# Patient Record
Sex: Female | Born: 1960
Health system: Southern US, Community
[De-identification: ages and names within clinical notes are randomized; demographics above are authoritative.]

## PROBLEM LIST (undated history)

## (undated) DIAGNOSIS — J45909 Unspecified asthma, uncomplicated: Secondary | ICD-10-CM

## (undated) HISTORY — DX: Unspecified asthma, uncomplicated: J45.909

## (undated) HISTORY — PX: WISDOM TOOTH EXTRACTION: SHX21

---

## 1997-08-14 ENCOUNTER — Other Ambulatory Visit: Admission: RE | Admit: 1997-08-14 | Discharge: 1997-08-14 | Payer: Self-pay | Admitting: Obstetrics and Gynecology

## 1997-09-02 ENCOUNTER — Inpatient Hospital Stay (HOSPITAL_COMMUNITY): Admission: AD | Admit: 1997-09-02 | Discharge: 1997-09-02 | Payer: Self-pay | Admitting: *Deleted

## 1997-09-04 ENCOUNTER — Inpatient Hospital Stay (HOSPITAL_COMMUNITY): Admission: AD | Admit: 1997-09-04 | Discharge: 1997-09-06 | Payer: Self-pay | Admitting: Obstetrics and Gynecology

## 1997-09-11 ENCOUNTER — Inpatient Hospital Stay (HOSPITAL_COMMUNITY): Admission: AD | Admit: 1997-09-11 | Discharge: 1997-09-13 | Payer: Self-pay | Admitting: Obstetrics and Gynecology

## 1997-09-25 ENCOUNTER — Inpatient Hospital Stay: Admission: AD | Admit: 1997-09-25 | Discharge: 1997-09-25 | Payer: Self-pay | Admitting: Obstetrics and Gynecology

## 1997-09-26 ENCOUNTER — Inpatient Hospital Stay (HOSPITAL_COMMUNITY): Admission: AD | Admit: 1997-09-26 | Discharge: 1997-09-29 | Payer: Self-pay | Admitting: Obstetrics and Gynecology

## 2000-09-04 ENCOUNTER — Other Ambulatory Visit: Admission: RE | Admit: 2000-09-04 | Discharge: 2000-09-04 | Payer: Self-pay | Admitting: Family Medicine

## 2000-09-11 ENCOUNTER — Encounter: Admission: RE | Admit: 2000-09-11 | Discharge: 2000-09-11 | Payer: Self-pay | Admitting: Family Medicine

## 2000-09-11 ENCOUNTER — Encounter: Payer: Self-pay | Admitting: Family Medicine

## 2010-03-20 ENCOUNTER — Encounter: Payer: Self-pay | Admitting: Family Medicine

## 2012-02-13 ENCOUNTER — Other Ambulatory Visit: Payer: Self-pay | Admitting: Family Medicine

## 2012-02-13 DIAGNOSIS — Z1231 Encounter for screening mammogram for malignant neoplasm of breast: Secondary | ICD-10-CM

## 2012-03-15 ENCOUNTER — Ambulatory Visit
Admission: RE | Admit: 2012-03-15 | Discharge: 2012-03-15 | Disposition: A | Discharge status: Home / Self Care | Payer: BC Managed Care – PPO | Source: Ambulatory Visit | Attending: Family Medicine | Admitting: Family Medicine

## 2012-03-15 DIAGNOSIS — Z1231 Encounter for screening mammogram for malignant neoplasm of breast: Secondary | ICD-10-CM

## 2012-04-05 ENCOUNTER — Encounter: Payer: Self-pay | Admitting: Family Medicine

## 2012-12-10 ENCOUNTER — Ambulatory Visit (INDEPENDENT_AMBULATORY_CARE_PROVIDER_SITE_OTHER): Payer: BC Managed Care – PPO | Admitting: *Deleted

## 2012-12-10 DIAGNOSIS — Z23 Encounter for immunization: Secondary | ICD-10-CM

## 2013-06-05 ENCOUNTER — Ambulatory Visit: Payer: BC Managed Care – PPO | Admitting: Family Medicine

## 2014-12-01 ENCOUNTER — Ambulatory Visit (INDEPENDENT_AMBULATORY_CARE_PROVIDER_SITE_OTHER): Payer: BLUE CROSS/BLUE SHIELD

## 2014-12-01 DIAGNOSIS — Z23 Encounter for immunization: Secondary | ICD-10-CM | POA: Diagnosis not present

## 2016-01-17 ENCOUNTER — Ambulatory Visit (INDEPENDENT_AMBULATORY_CARE_PROVIDER_SITE_OTHER): Payer: BLUE CROSS/BLUE SHIELD | Admitting: Urgent Care

## 2016-01-17 DIAGNOSIS — Z23 Encounter for immunization: Secondary | ICD-10-CM

## 2016-10-20 ENCOUNTER — Encounter: Payer: BLUE CROSS/BLUE SHIELD | Admitting: Obstetrics & Gynecology

## 2016-11-03 ENCOUNTER — Encounter: Payer: BLUE CROSS/BLUE SHIELD | Admitting: Obstetrics & Gynecology

## 2016-12-01 ENCOUNTER — Ambulatory Visit: Payer: BLUE CROSS/BLUE SHIELD | Admitting: Urgent Care

## 2017-05-18 ENCOUNTER — Other Ambulatory Visit: Payer: Self-pay | Admitting: Obstetrics & Gynecology

## 2017-05-18 ENCOUNTER — Other Ambulatory Visit: Payer: Self-pay | Admitting: Family Medicine

## 2017-05-18 DIAGNOSIS — Z1231 Encounter for screening mammogram for malignant neoplasm of breast: Secondary | ICD-10-CM

## 2017-08-31 ENCOUNTER — Ambulatory Visit: Payer: BLUE CROSS/BLUE SHIELD | Admitting: Obstetrics & Gynecology

## 2017-08-31 ENCOUNTER — Encounter: Payer: Self-pay | Admitting: Obstetrics & Gynecology

## 2017-08-31 VITALS — BP 136/80 | Ht 63.0 in | Wt 289.0 lb

## 2017-08-31 DIAGNOSIS — Z6841 Body Mass Index (BMI) 40.0 and over, adult: Secondary | ICD-10-CM | POA: Diagnosis not present

## 2017-08-31 DIAGNOSIS — Z01419 Encounter for gynecological examination (general) (routine) without abnormal findings: Secondary | ICD-10-CM

## 2017-08-31 DIAGNOSIS — Z789 Other specified health status: Secondary | ICD-10-CM | POA: Diagnosis not present

## 2017-08-31 DIAGNOSIS — N914 Secondary oligomenorrhea: Secondary | ICD-10-CM

## 2017-08-31 MED ORDER — MEDROXYPROGESTERONE ACETATE 5 MG PO TABS: 5.0000 mg | ORAL_TABLET | Freq: Every day | ORAL | 4 refills | Status: DC

## 2017-08-31 NOTE — Patient Instructions (Signed)
1. Encounter for routine gynecological examination with Papanicolaou smear of cervix Normal gynecologic exam.  Pap with high-risk HPV done today.  Breast exam normal.  Will schedule screening mammogram at the breast center now.  Needs to organize colonoscopy, refer to gastroenterology now.  Fasting health labs here today. - CBC - Comp Met (CMET) - Lipid panel - TSH - VITAMIN D 25 Hydroxy (Vit-D Deficiency, Fractures)  2. Use of condoms for contraception  3. Secondary oligomenorrhea Probably perimenopausal, which will prescribe Provera 5 mg/tab. 1 tablet per mouth daily for 10 days cyclically every 3 months if no spontaneous menstrual period.  Usage, risks and benefits reviewed with patient.  Counseling done on perimenopause and the importance of having a withdrawal bleeding at least every 3 months to prevent endometrial hyperplasia and cancer.  Patient instructed to use Provera every 3 months and if no withdrawal bleeding occurs twice in a row, that will be diagnostic of menopause and she can then stop doing the cyclic Provera.  4. Class 3 severe obesity due to excess calories without serious comorbidity with body mass index (BMI) of 50.0 to 59.9 in adult [facility]) Severe obesity with a body mass index of 51.19.  Planning a right knee replacement.  Needs to lose weight prior to surgery.  Patient will start on a fitness program with water aerobics, elliptical or biking.  Recommend exercising at least 5 times a week for aerobics and weight lifting every 2 days.  Recommend a low calorie/low carb diet such as [employer].  Other orders - medroxyPROGESTERone (PROVERA) 5 MG tablet; Take 1 tablet (5 mg total) by mouth daily for 10 days. Cyclic Provera every 3 months if no spontaneous menstrual period.  Ashley Bishop, it was a pleasure seeing you today!  I will inform you of your results as soon as they are available.  I will refer you to a gastroenterologist to organize a screening colonoscopy.

## 2017-08-31 NOTE — Addendum Note (Signed)
Addended by: Tito DineBONHAM, KIM A on: 08/31/2017 12:02 PM   Modules accepted: Orders

## 2017-08-31 NOTE — Progress Notes (Addendum)
Ashley Bishop Sep 10, 1960 [id number]   History:    57 y.o.   RP:  New patient, >3 yrs since last Gyn visit, presenting for annual gyn exam   HPI: Menses every 3-8 months in last 2 years.  Mild occasional hot flushes.  LMP 06/13/2017 was normal with light/moderate flow.  Had a menstrual period 8 months before that one.  No pelvic pain.  No pain with IC.  Normal vaginal secretions.  Breasts wnl.  BMI 51.19.  Seen by Ortho for Rt knee replacement 3 weeks ago.  Needs to loose weight before surgery. Will start on Water aerobics/Elleptical or bike.  Healthy diet with vegetables, but too high in carbs per patient.  Will do fasting Health Labs here today.  Past medical history,surgical history, family history and social history were all reviewed and documented in the EPIC chart.  Gynecologic History Patient's last menstrual period was 06/13/2017. Contraception: condoms Last Pap: 09/2013. Results were: negative/HPV HR neg Last mammogram: 05/2012 . Results were: Negative Bone Density: Never Colonoscopy: Never, will refer to [facility] now  Obstetric History OB History  Gravida Para Term Preterm AB Living  2       0 2  SAB TAB Ectopic Multiple Live Births      0        # Outcome Date GA Lbr Len/2nd Weight Sex Delivery Anes PTL Lv  2 Gravida           1 Gravida              ROS: A ROS was performed and pertinent positives and negatives are included in the history.  GENERAL: No fevers or chills. HEENT: No change in vision, no earache, sore throat or sinus congestion. NECK: No pain or stiffness. CARDIOVASCULAR: No chest pain or pressure. No palpitations. PULMONARY: No shortness of breath, cough or wheeze. GASTROINTESTINAL: No abdominal pain, nausea, vomiting or diarrhea, melena or bright red blood per rectum. GENITOURINARY: No urinary frequency, urgency, hesitancy or dysuria. MUSCULOSKELETAL: No joint or muscle pain, no back pain, no recent trauma. DERMATOLOGIC: No rash, no itching, no lesions. ENDOCRINE:  No polyuria, polydipsia, no heat or cold intolerance. No recent change in weight. HEMATOLOGICAL: No anemia or easy bruising or bleeding. NEUROLOGIC: No headache, seizures, numbness, tingling or weakness. PSYCHIATRIC: No depression, no loss of interest in normal activity or change in sleep pattern.     Exam:   BP 136/80 (BP Location: Right Arm, Patient Position: Sitting, Cuff Size: Normal)   Ht _0  (1.6 m)   Wt 289 lb (131.1 kg)   LMP 06/13/2017   BMI 51.19 kg/m   Body mass index is 51.19 kg/m.  General appearance : Well developed well nourished female. No acute distress HEENT: Eyes: no retinal hemorrhage or exudates,  Neck supple, trachea midline, no carotid bruits, no thyroidmegaly Lungs: Clear to auscultation, no rhonchi or wheezes, or rib retractions  Heart: Regular rate and rhythm, no murmurs or gallops Breast:Examined in sitting and supine position were symmetrical in appearance, no palpable masses or tenderness,  no skin retraction, no nipple inversion, no nipple discharge, no skin discoloration, no axillary or supraclavicular lymphadenopathy Abdomen: no palpable masses or tenderness, no rebound or guarding Extremities: no edema or skin discoloration or tenderness  Pelvic: Vulva: Normal             Vagina: No gross lesions or discharge  Cervix: No gross lesions or discharge.  Pap/HPV HR done.  Uterus  AV, normal size, shape  and consistency, non-tender and mobile  Adnexa  Without masses or tenderness  Anus: Normal   Assessment/Plan:  57 y.o. female for annual exam   1. Encounter for routine gynecological examination with Papanicolaou smear of cervix Normal gynecologic exam.  Pap with high-risk HPV done today.  Breast exam normal.  Will schedule screening mammogram at the breast center now.  Needs to organize colonoscopy, refer to gastroenterology now.  Fasting health labs here today. - CBC - Comp Met (CMET) - Lipid panel - TSH - VITAMIN D 25 Hydroxy (Vit-D Deficiency,  Fractures)  2. Use of condoms for contraception  3. Secondary oligomenorrhea Probably perimenopausal, which will prescribe Provera 5 mg/tab. 1 tablet per mouth daily for 10 days cyclically every 3 months if no spontaneous menstrual period.  Usage, risks and benefits reviewed with patient.  Counseling done on perimenopause and the importance of having a withdrawal bleeding at least every 3 months to prevent endometrial hyperplasia and cancer.  Patient instructed to use Provera every 3 months and if no withdrawal bleeding occurs twice in a row, that will be diagnostic of menopause and she can then stop doing the cyclic Provera.  4. Class 3 severe obesity due to excess calories without serious comorbidity with body mass index (BMI) of 50.0 to 59.9 in adult [facility]) Severe obesity with a body mass index of 51.19.  Planning a right knee replacement.  Needs to lose weight prior to surgery.  Patient will start on a fitness program with water aerobics, elliptical or biking.  Recommend exercising at least 5 times a week for aerobics and weight lifting every 2 days.  Recommend a low calorie/low carb diet such as [employer].  Other orders - medroxyPROGESTERone (PROVERA) 5 MG tablet; Take 1 tablet (5 mg total) by mouth daily for 10 days. Cyclic Provera every 3 months if no spontaneous menstrual period.  Counseling on above issues and coordination of care more than 50% for 10 minutes.  Princess Bruins MD, 10:51 AM 08/31/2017

## 2017-09-03 ENCOUNTER — Telehealth: Payer: Self-pay | Admitting: *Deleted

## 2017-09-03 ENCOUNTER — Encounter: Payer: Self-pay | Admitting: Gastroenterology

## 2017-09-03 DIAGNOSIS — Z1211 Encounter for screening for malignant neoplasm of colon: Secondary | ICD-10-CM

## 2017-09-03 LAB — PAP, TP IMAGING W/ HPV RNA, RFLX HPV TYPE 16,18/45: HPV DNA High Risk: NOT DETECTED

## 2017-09-03 NOTE — Telephone Encounter (Signed)
Patient scheduled on 10/26/17 @ 11:00am

## 2017-09-03 NOTE — Telephone Encounter (Signed)
-----   Message from Genia DelMarie-Lyne Lavoie, MD sent at 08/31/2017 11:24 AM EDT ----- Regarding: Refer to [facility] for Screening Colonoscopy 57 yo

## 2017-09-03 NOTE — Telephone Encounter (Signed)
Referral placed at [facility] they will call to schedule. Pt aware.

## 2017-09-04 LAB — LIPID PANEL
Cholesterol: 181 mg/dL (ref <200)
HDL: 54 mg/dL (ref >50)
LDL Cholesterol (Calc): 107 mg/dL (calc) — ABNORMAL HIGH
Non-HDL Cholesterol (Calc): 127 mg/dL (calc) (ref <130)
Total CHOL/HDL Ratio: 3.4 (calc) (ref <5.0)
Triglycerides: 107 mg/dL (ref <150)

## 2017-09-04 LAB — CBC
HCT: 41.7 % (ref 35.0–45.0)
Hemoglobin: 13.4 g/dL (ref 11.7–15.5)
MCH: 24.9 pg — ABNORMAL LOW (ref 27.0–33.0)
MCHC: 32.1 g/dL (ref 32.0–36.0)
MCV: 77.5 fL — ABNORMAL LOW (ref 80.0–100.0)
MPV: 10.3 fL (ref 7.5–12.5)
Platelets: 304 10*3/uL (ref 140–400)
RBC: 5.38 10*6/uL — ABNORMAL HIGH (ref 3.80–5.10)
RDW: 14.4 % (ref 11.0–15.0)
WBC: 5.7 10*3/uL (ref 3.8–10.8)

## 2017-09-04 LAB — TEST AUTHORIZATION

## 2017-09-04 LAB — COMPREHENSIVE METABOLIC PANEL
AG Ratio: 1.4 (calc) (ref 1.0–2.5)
ALT: 23 U/L (ref 6–29)
AST: 22 U/L (ref 10–35)
Albumin: 3.9 g/dL (ref 3.6–5.1)
Alkaline phosphatase (APISO): 100 U/L (ref 33–130)
BUN: 16 mg/dL (ref 7–25)
CO2: 27 mmol/L (ref 20–32)
Calcium: 9.3 mg/dL (ref 8.6–10.4)
Chloride: 101 mmol/L (ref 98–110)
Creat: 0.71 mg/dL (ref 0.50–1.05)
Globulin: 2.8 g/dL (calc) (ref 1.9–3.7)
Glucose, Bld: 110 mg/dL — ABNORMAL HIGH (ref 65–99)
Potassium: 5 mmol/L (ref 3.5–5.3)
Sodium: 139 mmol/L (ref 135–146)
Total Bilirubin: 0.5 mg/dL (ref 0.2–1.2)
Total Protein: 6.7 g/dL (ref 6.1–8.1)

## 2017-09-04 LAB — TSH: TSH: 1.56 m[IU]/L (ref 0.40–4.50)

## 2017-09-04 LAB — HEMOGLOBIN A1C
Hgb A1c MFr Bld: 5.9 % of total Hgb — ABNORMAL HIGH (ref <5.7)
Mean Plasma Glucose: 123 (calc)
eAG (mmol/L): 6.8 (calc)

## 2017-09-04 LAB — VITAMIN D 25 HYDROXY (VIT D DEFICIENCY, FRACTURES): Vit D, 25-Hydroxy: 12 ng/mL — ABNORMAL LOW (ref 30–100)

## 2017-11-09 ENCOUNTER — Encounter: Payer: Self-pay | Admitting: Gastroenterology

## 2017-12-04 ENCOUNTER — Ambulatory Visit (INDEPENDENT_AMBULATORY_CARE_PROVIDER_SITE_OTHER): Payer: BLUE CROSS/BLUE SHIELD | Admitting: Family Medicine

## 2017-12-04 DIAGNOSIS — Z23 Encounter for immunization: Secondary | ICD-10-CM

## 2018-02-11 ENCOUNTER — Other Ambulatory Visit: Payer: Self-pay | Admitting: Obstetrics & Gynecology

## 2018-02-11 DIAGNOSIS — R5381 Other malaise: Secondary | ICD-10-CM

## 2018-02-18 ENCOUNTER — Encounter: Payer: Self-pay | Admitting: Obstetrics & Gynecology

## 2018-02-18 ENCOUNTER — Ambulatory Visit: Payer: BLUE CROSS/BLUE SHIELD | Admitting: Obstetrics & Gynecology

## 2018-02-18 VITALS — BP 136/80

## 2018-02-18 DIAGNOSIS — Z803 Family history of malignant neoplasm of breast: Secondary | ICD-10-CM | POA: Diagnosis not present

## 2018-02-18 DIAGNOSIS — N951 Menopausal and female climacteric states: Secondary | ICD-10-CM

## 2018-02-18 DIAGNOSIS — N644 Mastodynia: Secondary | ICD-10-CM | POA: Diagnosis not present

## 2018-02-18 NOTE — Progress Notes (Signed)
    Ashley Bishop Ashley Bishop 05/31/1960 [id number]        57 y.o.  G2P2L2   RP: Bilateral breast pain/tenderness on-off x 6 months  HPI: Patient complains of bilateral breast pain and tenderness intermittently for the last 6 months.  No lump felt.  No change in skin.  No nipple discharge.  No breast trauma.  Amenorrhea x 06/2017.  Occasional hot flushes.  Sister with Breast Ca at age 11053.  No recent mammogram, last screening was negative in April 2014.   OB History  Gravida Para Term Preterm AB Living  2       0 2  SAB TAB Ectopic Multiple Live Births      0        # Outcome Date GA Lbr Len/2nd Weight Sex Delivery Anes PTL Lv  2 Gravida           1 Gravida             Past medical history,surgical history, problem list, medications, allergies, family history and social history were all reviewed and documented in the EPIC chart.   Directed ROS with pertinent positives and negatives documented in the history of present illness/assessment and plan.  Exam:  Vitals:   02/18/18 0956  BP: 136/80   General appearance:  Normal  Breast exam: Normal bilateral breast exam except for tenderness bilaterally on the lower external aspects.  No nodule or mass felt.  No skin change.  No nipple discharge.  Axillae are normal bilaterally.   Assessment/Plan:  57 y.o. G2P0002   1. Pain of both breasts Whole breast tenderness associated with perimenopausal changes in hormones as well as fibrocystic breast disease.  Will rule out more significant pathology especially in the context of h/o breast cancer in sister.  2. Family history of breast cancer in sister Patient will inform herself with sister of any genetic testing done.  If no testing done/or if positive results, will refer for genetic counseling/testing.  3. Perimenopausal Verify menopausal status with FSH. - FSH  Counseling on above issues and coordination of care >50% x 25 minutes.  Genia DelMarie-Lyne Seline Enzor MD, 10:08 AM 02/18/2018

## 2018-02-19 LAB — FOLLICLE STIMULATING HORMONE: FSH: 16.5 m[IU]/mL

## 2018-02-21 ENCOUNTER — Telehealth: Payer: Self-pay | Admitting: *Deleted

## 2018-02-21 ENCOUNTER — Encounter: Payer: Self-pay | Admitting: Obstetrics & Gynecology

## 2018-02-21 ENCOUNTER — Other Ambulatory Visit: Payer: Self-pay | Admitting: *Deleted

## 2018-02-21 DIAGNOSIS — N644 Mastodynia: Secondary | ICD-10-CM

## 2018-02-21 DIAGNOSIS — Z803 Family history of malignant neoplasm of breast: Secondary | ICD-10-CM

## 2018-02-21 MED ORDER — MEDROXYPROGESTERONE ACETATE 5 MG PO TABS: ORAL_TABLET | ORAL | 4 refills | Status: DC

## 2018-02-21 NOTE — Telephone Encounter (Signed)
-----   Message from Genia DelMarie-Lyne Lavoie, MD sent at 02/18/2018 10:18 AM EST ----- Regarding: Refer for Bilateral Dx mammo/US Bilateral breast pain/tenderness.  Sister with Breast Ca.

## 2018-02-21 NOTE — Patient Instructions (Signed)
1. Pain of both breasts Whole breast tenderness associated with perimenopausal changes in hormones as well as fibrocystic breast disease.  Will rule out more significant pathology especially in the context of h/o breast cancer in sister.  2. Family history of breast cancer in sister Patient will inform herself with sister of any genetic testing done.  If no testing done/or if positive results, will refer for genetic counseling/testing.  3. Perimenopausal Verify menopausal status with FSH. - FSH  Ashley Bishop, it was a pleasure seeing you today!  I will inform you of your results as soon as they are available.

## 2018-02-21 NOTE — Telephone Encounter (Signed)
Appointment at breast center on 02/25/18 @ 2:30pm patient informed

## 2018-02-25 ENCOUNTER — Ambulatory Visit: Payer: Self-pay

## 2018-02-25 ENCOUNTER — Ambulatory Visit
Admission: RE | Admit: 2018-02-25 | Discharge: 2018-02-25 | Disposition: A | Discharge status: Home / Self Care | Payer: BLUE CROSS/BLUE SHIELD | Source: Ambulatory Visit | Attending: Obstetrics & Gynecology | Admitting: Obstetrics & Gynecology

## 2018-02-25 DIAGNOSIS — Z803 Family history of malignant neoplasm of breast: Secondary | ICD-10-CM

## 2018-02-25 DIAGNOSIS — R928 Other abnormal and inconclusive findings on diagnostic imaging of breast: Secondary | ICD-10-CM | POA: Diagnosis not present

## 2018-02-25 DIAGNOSIS — N644 Mastodynia: Secondary | ICD-10-CM

## 2018-09-04 ENCOUNTER — Encounter: Payer: BLUE CROSS/BLUE SHIELD | Admitting: Obstetrics & Gynecology

## 2018-12-06 ENCOUNTER — Ambulatory Visit (INDEPENDENT_AMBULATORY_CARE_PROVIDER_SITE_OTHER): Payer: BC Managed Care – PPO | Admitting: Family Medicine

## 2018-12-06 ENCOUNTER — Other Ambulatory Visit: Payer: Self-pay

## 2018-12-06 DIAGNOSIS — Z23 Encounter for immunization: Secondary | ICD-10-CM

## 2018-12-06 NOTE — Patient Instructions (Signed)
Injection given in the right deltoid, she tolerated well.  

## 2019-07-07 ENCOUNTER — Other Ambulatory Visit: Payer: Self-pay | Admitting: Family Medicine

## 2019-07-07 DIAGNOSIS — Z1231 Encounter for screening mammogram for malignant neoplasm of breast: Secondary | ICD-10-CM

## 2019-07-18 ENCOUNTER — Other Ambulatory Visit: Payer: Self-pay

## 2019-07-18 ENCOUNTER — Ambulatory Visit
Admission: RE | Admit: 2019-07-18 | Discharge: 2019-07-18 | Disposition: A | Discharge status: Home / Self Care | Payer: BLUE CROSS/BLUE SHIELD | Source: Ambulatory Visit | Attending: Family Medicine | Admitting: Family Medicine

## 2019-07-18 DIAGNOSIS — Z1231 Encounter for screening mammogram for malignant neoplasm of breast: Secondary | ICD-10-CM | POA: Diagnosis not present

## 2019-07-22 ENCOUNTER — Other Ambulatory Visit: Payer: Self-pay | Admitting: Family Medicine

## 2019-07-22 DIAGNOSIS — N6320 Unspecified lump in the left breast, unspecified quadrant: Secondary | ICD-10-CM

## 2019-08-07 ENCOUNTER — Other Ambulatory Visit: Payer: Self-pay | Admitting: Family Medicine

## 2019-08-07 ENCOUNTER — Ambulatory Visit
Admission: RE | Admit: 2019-08-07 | Discharge: 2019-08-07 | Disposition: A | Discharge status: Home / Self Care | Payer: BC Managed Care – PPO | Source: Ambulatory Visit | Attending: Family Medicine | Admitting: Family Medicine

## 2019-08-07 ENCOUNTER — Other Ambulatory Visit: Payer: Self-pay

## 2019-08-07 DIAGNOSIS — R599 Enlarged lymph nodes, unspecified: Secondary | ICD-10-CM

## 2019-08-07 DIAGNOSIS — N6320 Unspecified lump in the left breast, unspecified quadrant: Secondary | ICD-10-CM

## 2019-08-07 DIAGNOSIS — R2232 Localized swelling, mass and lump, left upper limb: Secondary | ICD-10-CM | POA: Diagnosis not present

## 2019-11-10 ENCOUNTER — Other Ambulatory Visit: Payer: Self-pay

## 2019-11-10 ENCOUNTER — Ambulatory Visit
Admission: RE | Admit: 2019-11-10 | Discharge: 2019-11-10 | Disposition: A | Discharge status: Home / Self Care | Payer: BC Managed Care – PPO | Source: Ambulatory Visit | Attending: Family Medicine | Admitting: Family Medicine

## 2019-11-10 DIAGNOSIS — R599 Enlarged lymph nodes, unspecified: Secondary | ICD-10-CM

## 2019-11-10 DIAGNOSIS — R590 Localized enlarged lymph nodes: Secondary | ICD-10-CM | POA: Diagnosis not present

## 2020-02-06 ENCOUNTER — Ambulatory Visit (INDEPENDENT_AMBULATORY_CARE_PROVIDER_SITE_OTHER): Payer: BC Managed Care – PPO | Admitting: Registered Nurse

## 2020-02-06 ENCOUNTER — Other Ambulatory Visit: Payer: Self-pay

## 2020-02-06 DIAGNOSIS — Z23 Encounter for immunization: Secondary | ICD-10-CM | POA: Diagnosis not present

## 2020-02-06 NOTE — Patient Instructions (Signed)
° ° ° °  If you have lab work done today you will be contacted with your lab results within the next 2 weeks.  If you have not heard from us then please contact us. The fastest way to get your results is to register for My Chart. ° ° °IF you received an x-ray today, you will receive an invoice from [facility]. Please contact [facility] at [phone] with questions or concerns regarding your invoice.  ° °IF you received labwork today, you will receive an invoice from [facility]. Please contact [facility] at [phone] with questions or concerns regarding your invoice.  ° °Our billing staff will not be able to assist you with questions regarding bills from these companies. ° °You will be contacted with the lab results as soon as they are available. The fastest way to get your results is to activate your My Chart account. Instructions are located on the last page of this paperwork. If you have not heard from us regarding the results in 2 weeks, please contact this office. °  ° ° ° °

## 2020-04-09 ENCOUNTER — Ambulatory Visit: Payer: BC Managed Care – PPO | Admitting: Registered Nurse

## 2020-08-20 IMAGING — MG DIGITAL SCREENING BILAT W/ TOMO W/ CAD
8 of 15 series · 8 of 40 positions shown · non-contrast
Comparison: Previous exam(s).

CLINICAL DATA: Screening.

EXAM:
DIGITAL SCREENING BILATERAL MAMMOGRAM WITH TOMO AND CAD

[R CC synth-2D (1 of 2)]
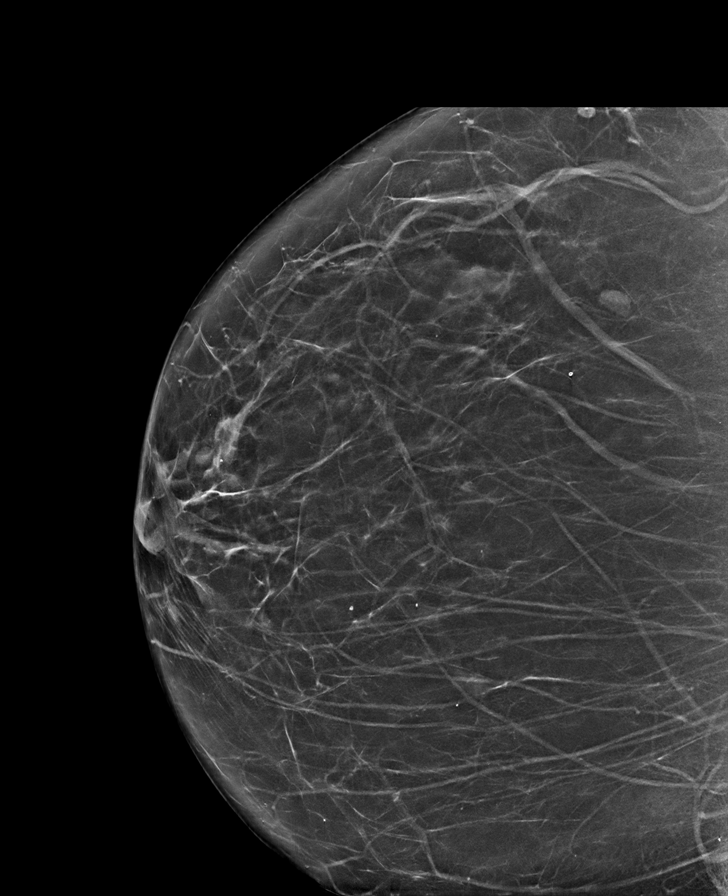

[R CV synth-2D]
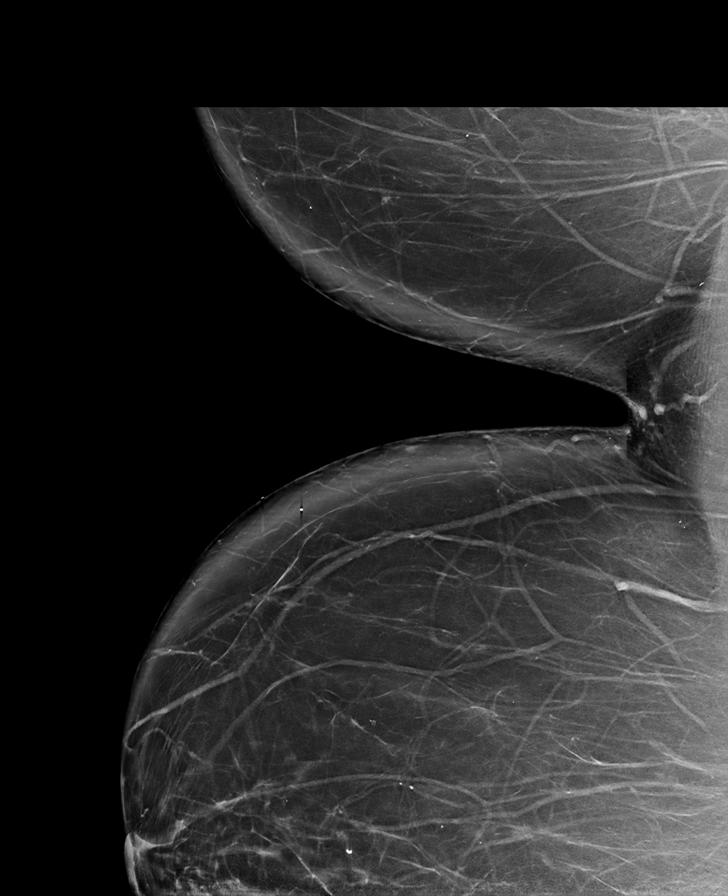

[L MLO synth-2D]
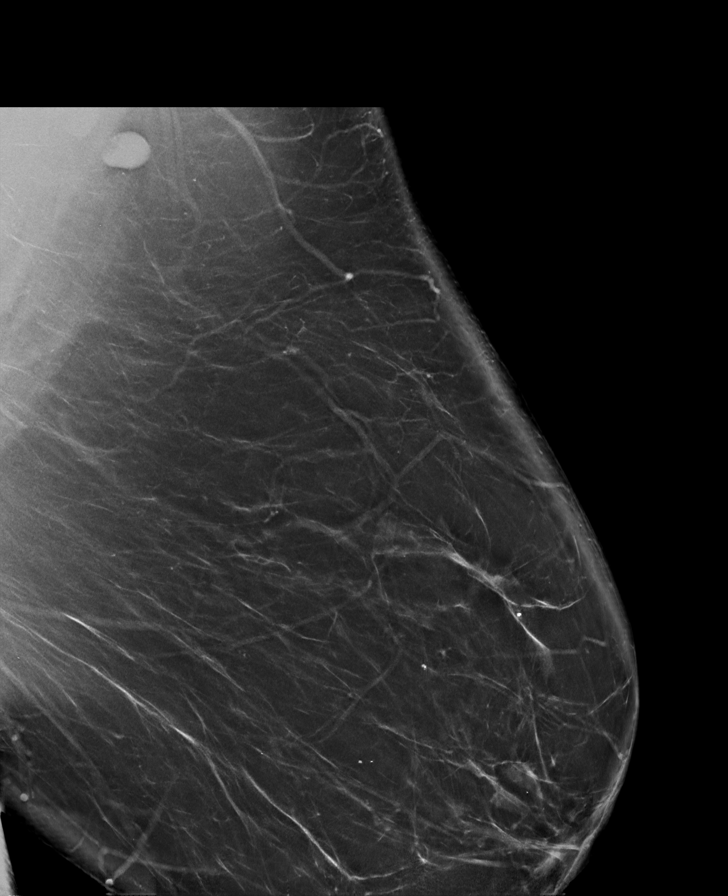

[R MLO synth-2D (1 of 2)]
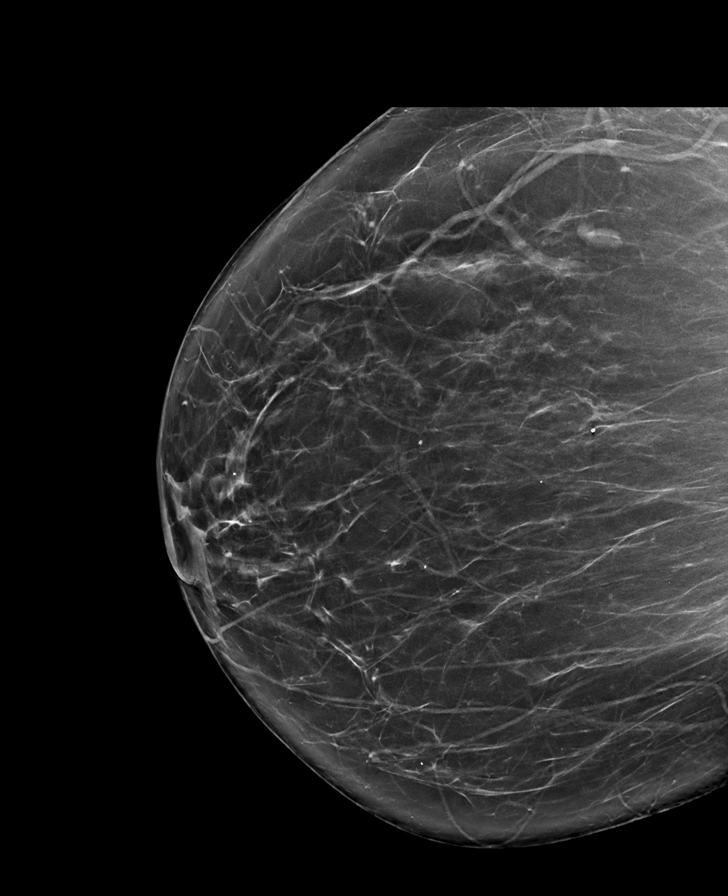

[R CC synth-2D (2 of 2)]
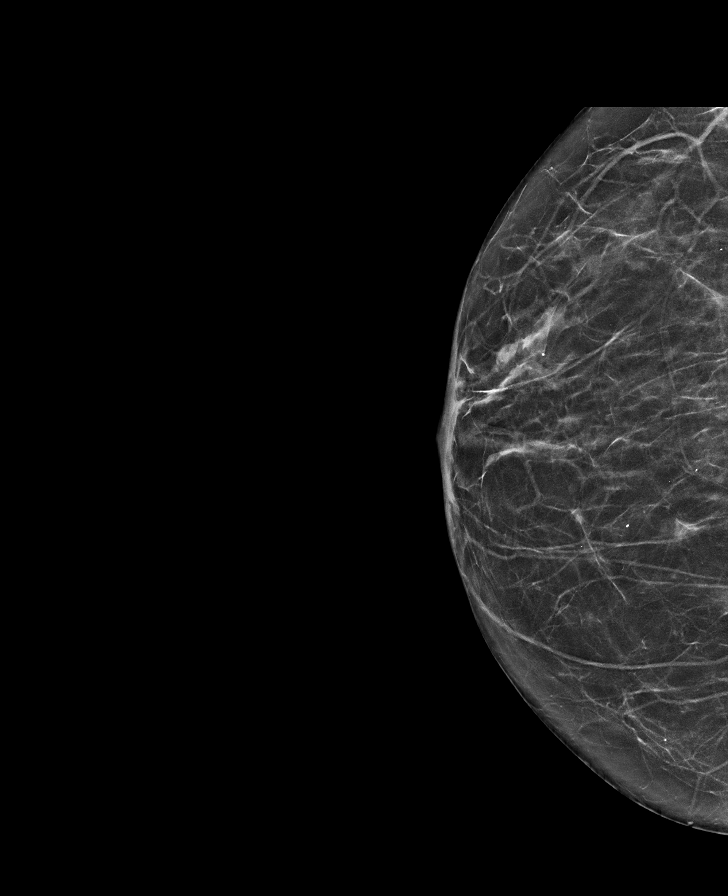

[R MLO synth-2D (2 of 2)]
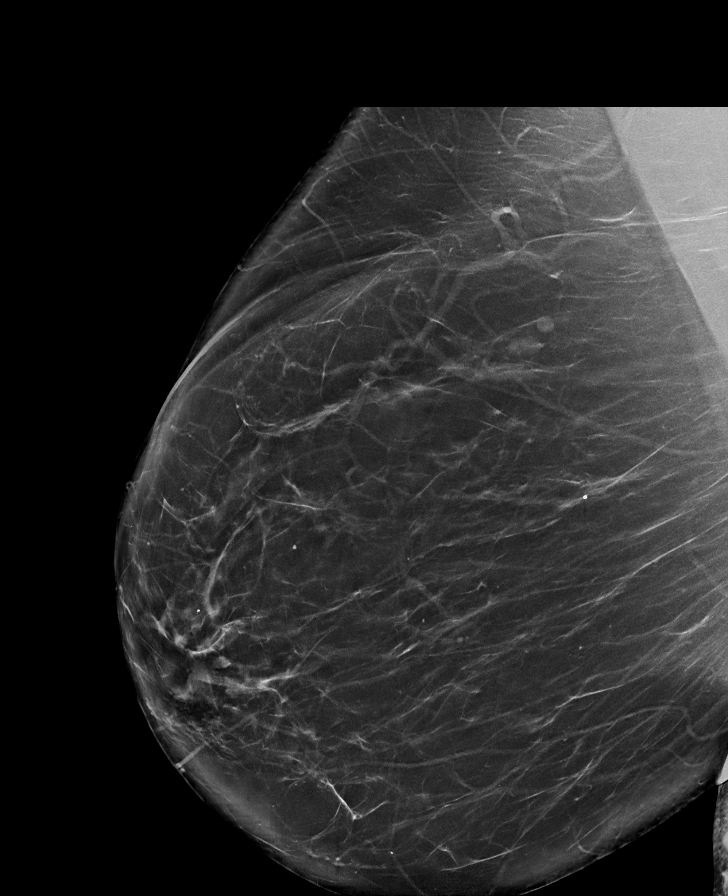

[L CC synth-2D]
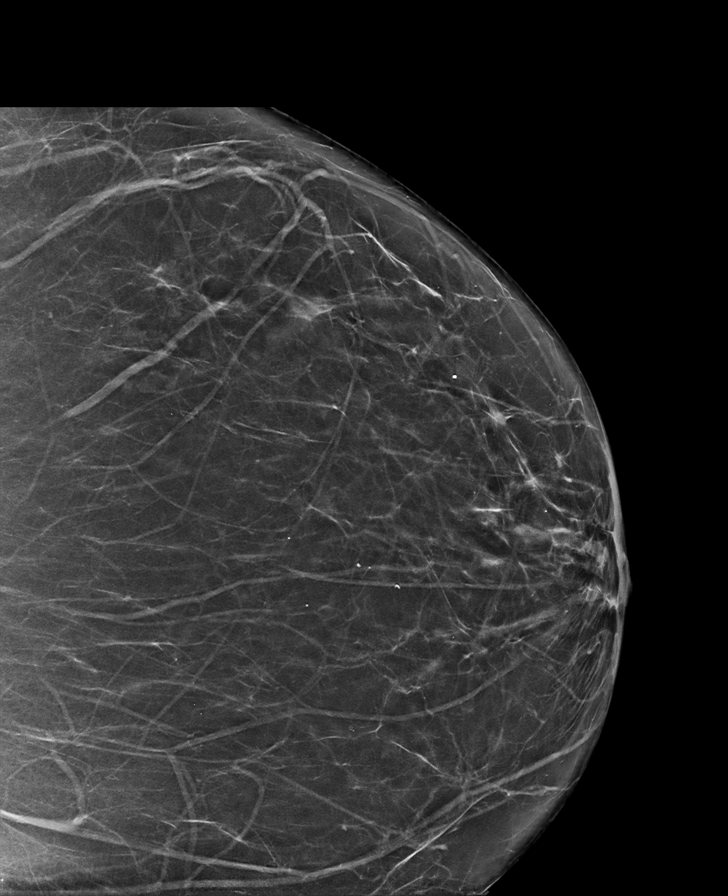

[R MLO tomo · tomo slice 58/85.0]
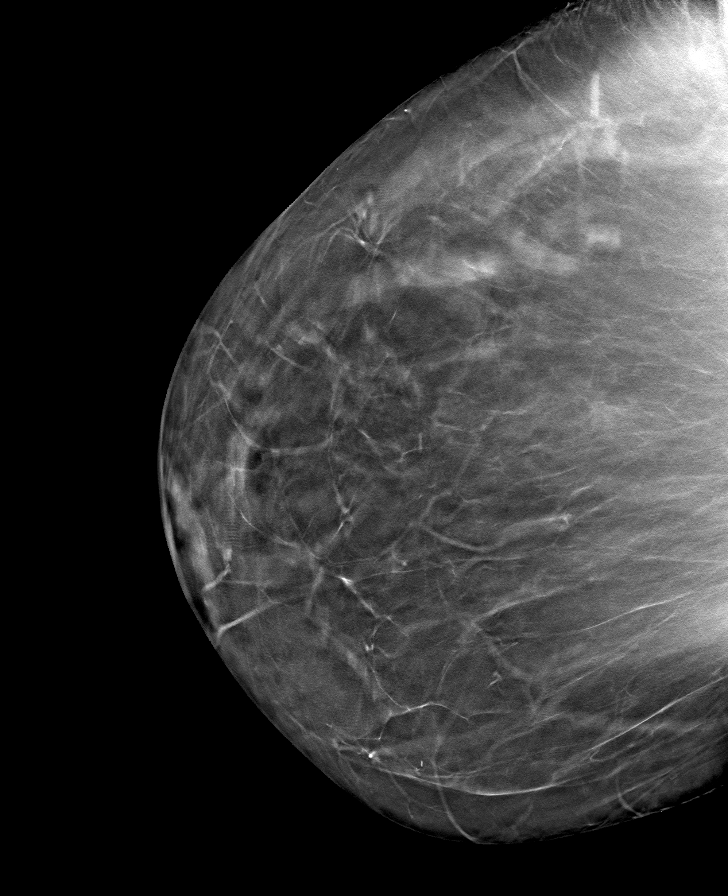

[8 of 40 positions shown; findings below may reference images not displayed]

ACR Breast Density Category b: There are scattered areas of
fibroglandular density.
FINDINGS: In the left axilla, a possible mass warrants further evaluation. In
the right breast, no findings suspicious for malignancy.

Images were processed with CAD.
IMPRESSION: Further evaluation is suggested for possible mass in the left
axilla.

RECOMMENDATION:
Ultrasound of the left axilla. (Code:12-T-22I)

The patient will be contacted regarding the findings, and additional
imaging will be scheduled.

BI-RADS CATEGORY  0: Incomplete. Need additional imaging evaluation
and/or prior mammograms for comparison.

## 2020-10-01 ENCOUNTER — Encounter (HOSPITAL_BASED_OUTPATIENT_CLINIC_OR_DEPARTMENT_OTHER): Payer: Self-pay | Admitting: *Deleted

## 2020-10-01 ENCOUNTER — Other Ambulatory Visit: Payer: Self-pay

## 2020-10-01 ENCOUNTER — Emergency Department (HOSPITAL_BASED_OUTPATIENT_CLINIC_OR_DEPARTMENT_OTHER)
Admission: EM | Admit: 2020-10-01 | Discharge: 2020-10-01 | Disposition: A | Discharge status: Home / Self Care | Payer: BC Managed Care – PPO | Attending: Emergency Medicine | Admitting: Emergency Medicine

## 2020-10-01 DIAGNOSIS — R221 Localized swelling, mass and lump, neck: Secondary | ICD-10-CM | POA: Diagnosis not present

## 2020-10-01 DIAGNOSIS — K115 Sialolithiasis: Secondary | ICD-10-CM | POA: Insufficient documentation

## 2020-10-01 DIAGNOSIS — R220 Localized swelling, mass and lump, head: Secondary | ICD-10-CM | POA: Diagnosis present

## 2020-10-01 NOTE — ED Triage Notes (Signed)
While eating lunch, felt as if she had a crick in her neck. Pt then noticed left posterior jaw/neck swelling. No pain

## 2020-10-01 NOTE — ED Provider Notes (Signed)
[facility] EMERGENCY DEPT Provider Note   CSN: [id number] Arrival date & time: 10/01/20  1303     History Chief Complaint  Patient presents with   Swelling in posterior jaw    Ashley Bishop is a 60 y.o. female.  Patient is a 60 year old female with no significant medical history who is presenting today due to sudden swelling in the left side of her neck when she was eating lunch today.  She just noticed it felt full and that like a little crick in her neck and noticed significant swelling.  She has no significant pain, trouble swallowing or breathing.  Never had anything like this before.  Symptoms have been persistent since about lunch but have not gotten significantly worse.  No recent infectious symptoms such as fever, congestion, malaise.  Was completely normal this morning prior to this starting at lunch.  The history is provided by the patient.      History reviewed. No pertinent past medical history.  There are no problems to display for this patient.   Past Surgical History:  Procedure Laterality Date   WISDOM TOOTH EXTRACTION       OB History     Gravida  2   Para      Term      Preterm      AB  0   Living  2      SAB      IAB      Ectopic  0   Multiple      Live Births              Family History  Problem Relation Age of Onset   Heart defect Mother    Transient ischemic attack Father    Breast cancer Sister    Diabetes Maternal Grandmother     Social History   Tobacco Use   Smoking status: Never   Smokeless tobacco: Never  Vaping Use   Vaping Use: Never used  Substance Use Topics   Alcohol use: Yes   Drug use: Never    Home Medications Prior to Admission medications   Medication Sig Start Date End Date Taking? Authorizing Provider  medroxyPROGESTERone (PROVERA) 5 MG tablet Take one tablet by mouth daily for 10 days every 3 months. 02/21/18   Genia Del, MD    Allergies     Erythromycin  Review of Systems   Review of Systems  All other systems reviewed and are negative.  Physical Exam Updated Vital Signs BP 122/74   Pulse 78   Temp 98.3 F (36.8 C) (Oral)   Resp 16   Ht 5\' 3"  (1.6 m)   Wt 124.7 kg   LMP 07/19/2017   SpO2 99%   BMI 48.71 kg/m   Physical Exam Vitals and nursing note reviewed.  Constitutional:      General: She is not in acute distress.    Appearance: Normal appearance.  HENT:     Head: Normocephalic and atraumatic.     Right Ear: Tympanic membrane normal.     Left Ear: Tympanic membrane normal.     Nose: Nose normal.     Mouth/Throat:     Mouth: Mucous membranes are moist.  Eyes:     Extraocular Movements: Extraocular movements intact.     Pupils: Pupils are equal, round, and reactive to light.  Neck:   Cardiovascular:     Rate and Rhythm: Normal rate.  Pulmonary:     Effort: Pulmonary effort is  normal.  Musculoskeletal:     Cervical back: Normal range of motion and neck supple.  Skin:    General: Skin is warm and dry.  Neurological:     Mental Status: She is alert. Mental status is at baseline.  Psychiatric:        Mood and Affect: Mood normal.        Behavior: Behavior normal.    ED Results / Procedures / Treatments   Labs (all labs ordered are listed, but only abnormal results are displayed) Labs Reviewed - No data to display  EKG None  Radiology No results found.  Procedures Procedures   Medications Ordered in ED Medications - No data to display  ED Course  I have reviewed the triage vital signs and the nursing notes.  Pertinent labs & imaging results that were available during my care of the patient were reviewed by me and considered in my medical decision making (see chart for details).    MDM Rules/Calculators/A&P                           Patient presenting today with symptoms most consistent with parotid sialolithiasis.  She has no findings to suggest parotitis.  She has no airway  involvement and otherwise is well-appearing.  Recommended that patient try lemon candies and given ENT follow-up and return precautions.  Final Clinical Impression(s) / ED Diagnoses Final diagnoses:  Parotid sialolithiasis    Rx / DC Orders ED Discharge Orders     None        Gwyneth Sprout, MD 10/01/20 1759

## 2020-10-01 NOTE — ED Notes (Signed)
Patient verbalizes understanding of discharge instructions. Opportunity for questioning and answers were provided. Patient discharged from ED.  °

## 2020-10-01 NOTE — Discharge Instructions (Addendum)
It looks like you most likely have a stone stuck in the salivary gland causing swelling.  Try using very sour candy or lemon heads.  If you start developing severe pain, fever, difficulty swallowing or other concerns return to the emergency room.  If it is not resolved by Monday follow-up with ENT.

## 2020-12-13 IMAGING — US US AXILLARY LEFT
1 series · 9 of 9 positions shown · non-contrast
Comparison: 08/07/2019.

CLINICAL DATA: Three-month interval follow-up of a likely benign
reactive LEFT axillary lymph node identified on screening
mammography. The patient had her [id number] vaccine in the LEFT arm,
the second vaccine in late April 2019.

EXAM:
ULTRASOUND OF THE LEFT AXILLA

[Series 1: us axillary left · 0.10mm/px · 9 of 9 slices shown]
[im 1/9]
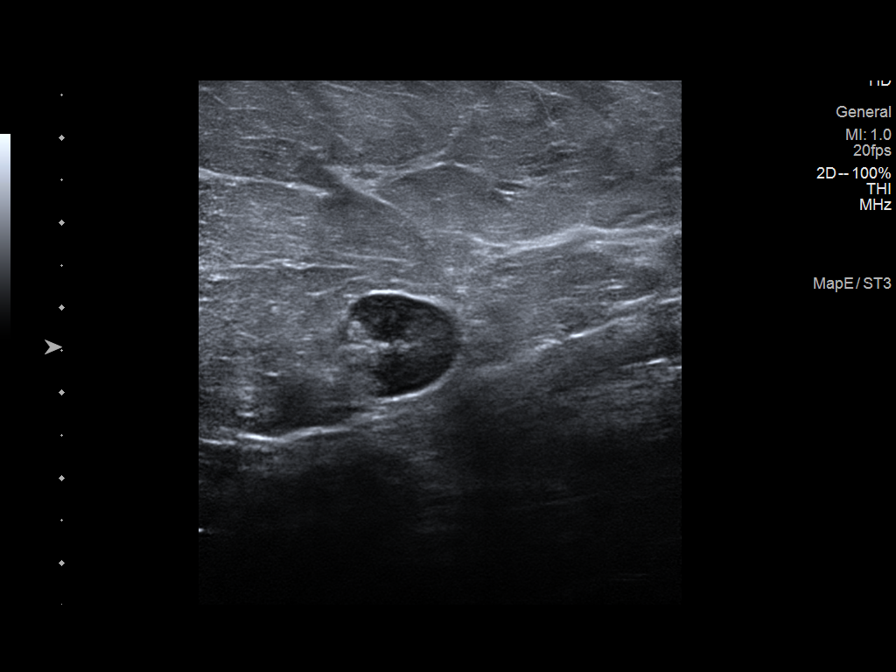
[im 2/9]
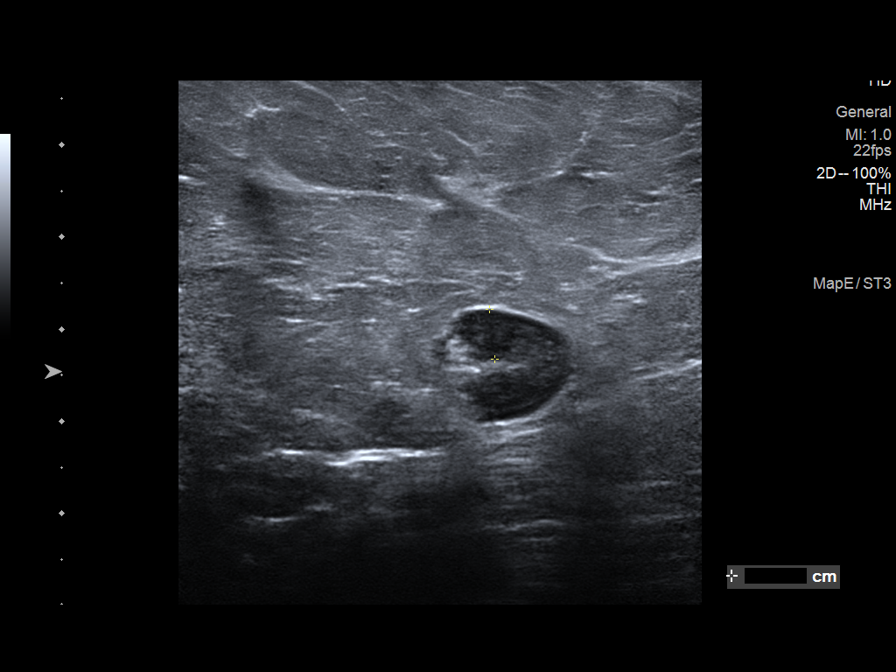
[im 3/9]
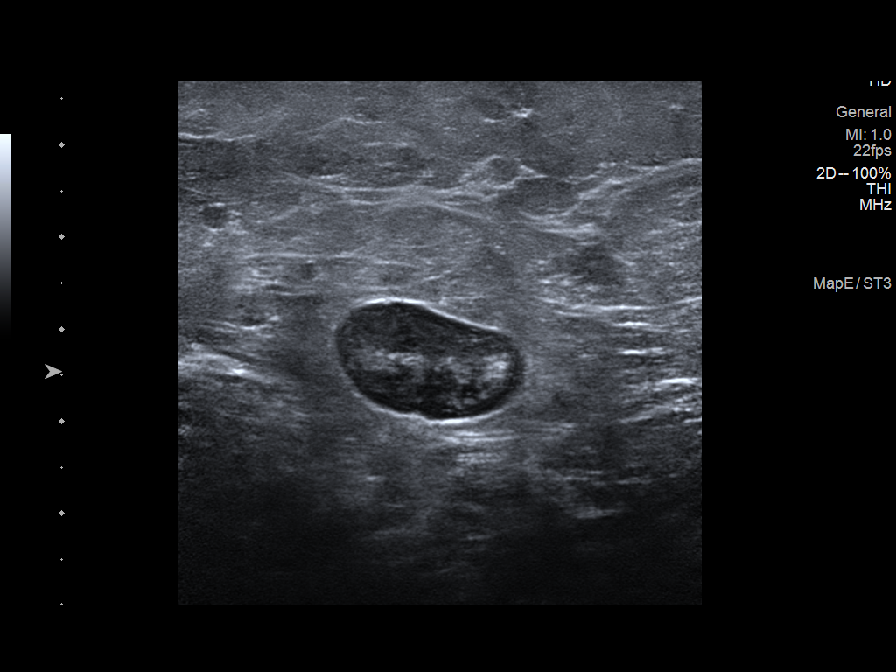
[im 4/9]
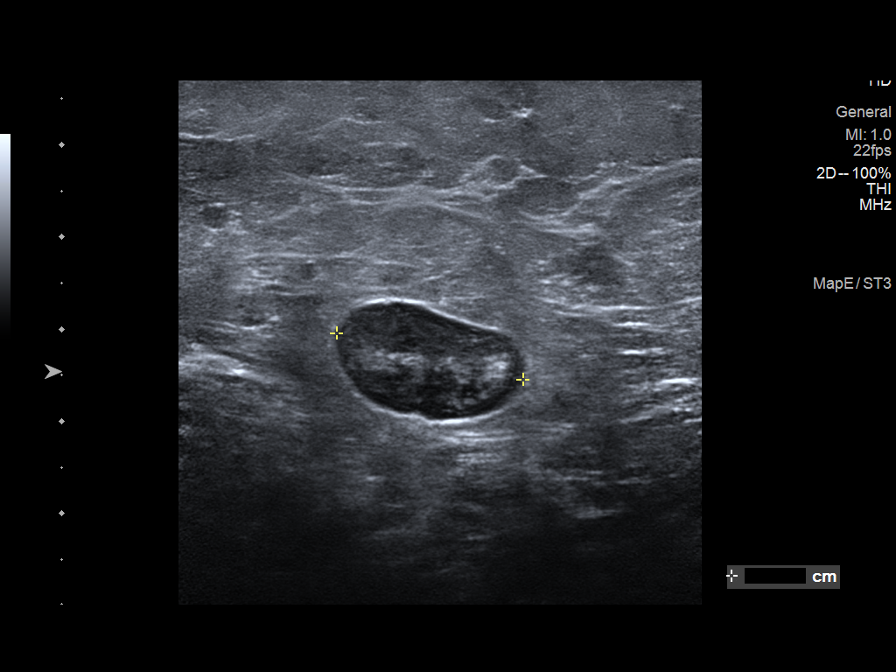
[im 5/9]
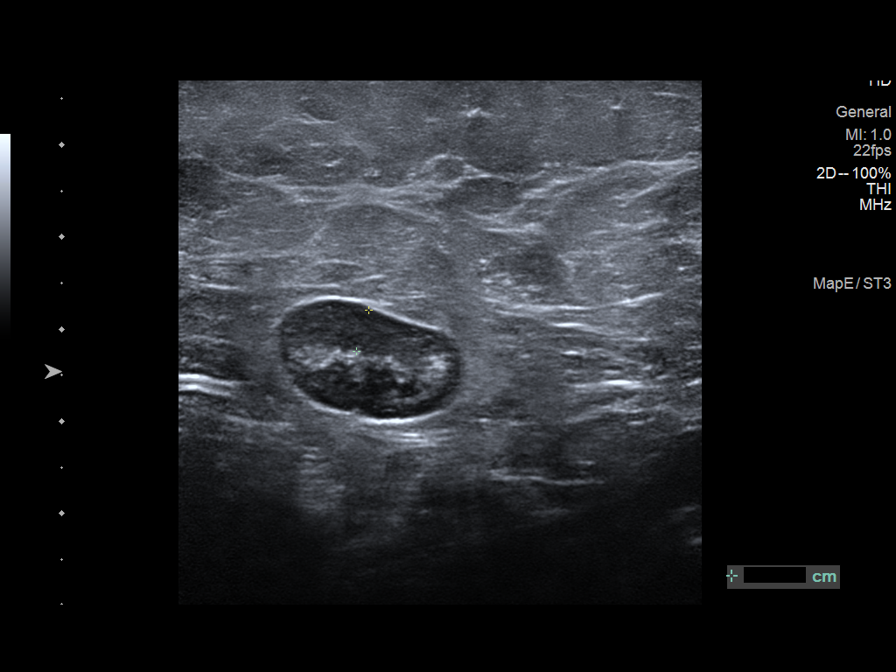
[im 6/9]
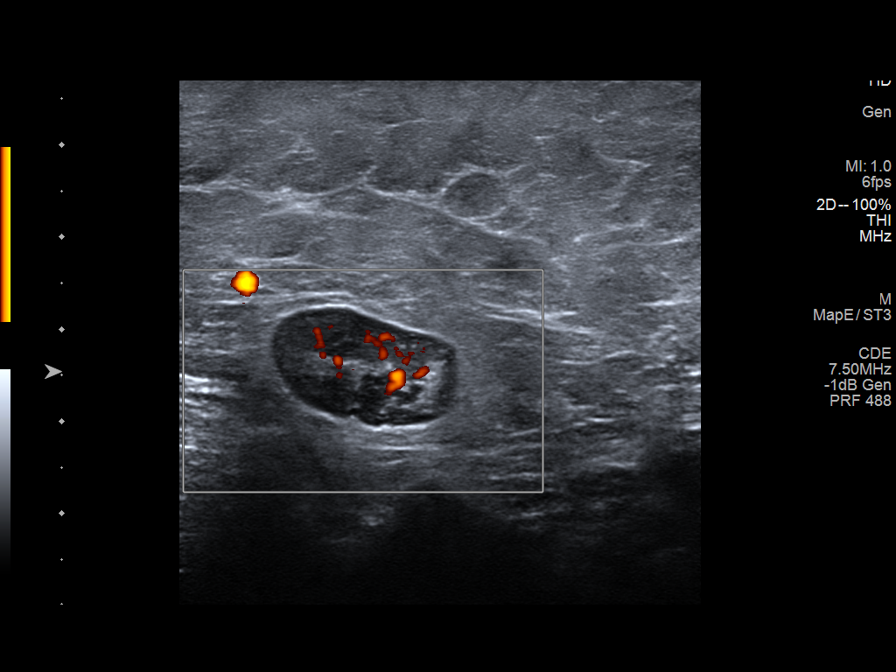
[im 7/9]
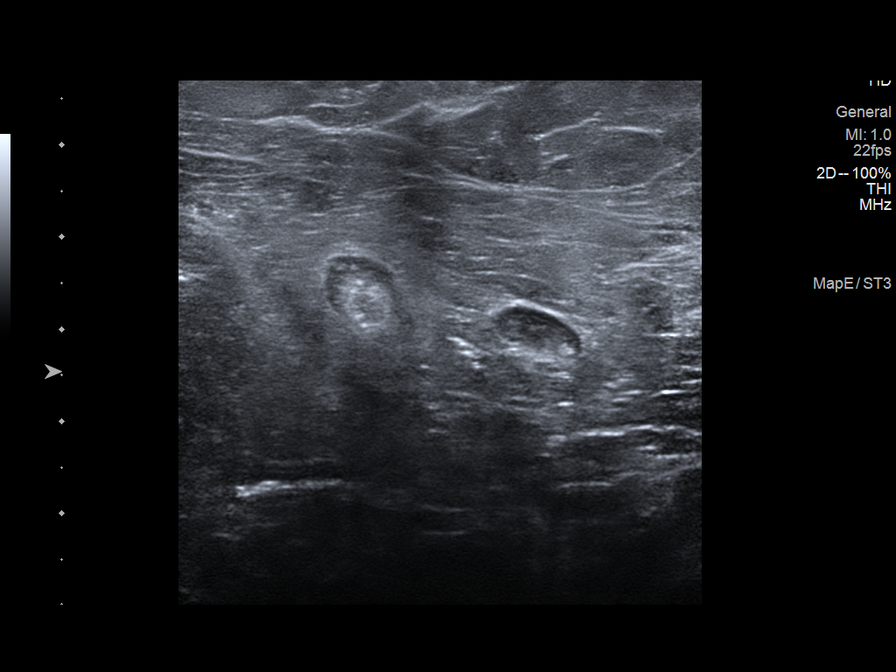
[im 8/9]
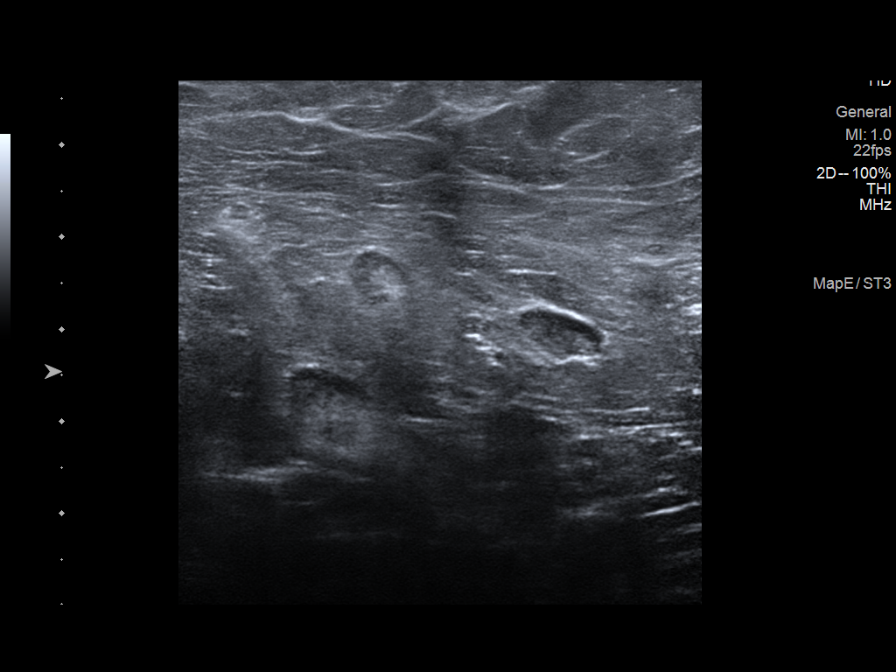
[im 9/9]
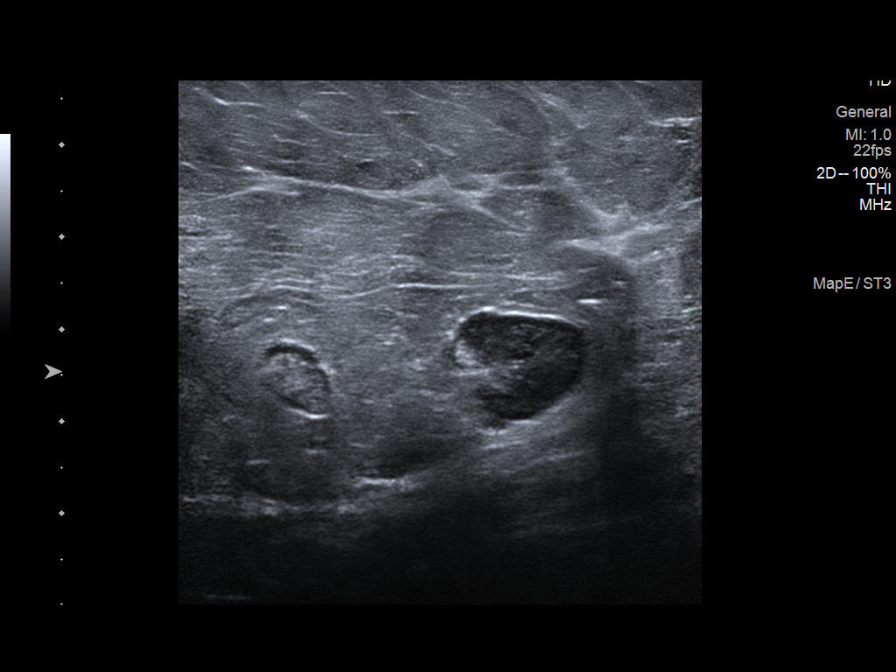

[9 of 9 positions shown; findings below may reference images not displayed]

FINDINGS: The previously identified morphologically normal LEFT axillary lymph
node with mild cortical thickening up to approximately 5 mm is
unchanged in appearance. The node measures approximately 2.1 cm in
length. Multiple other visualized LEFT axillary lymph nodes are
normal in appearance with normal cortical thickness. No new or
enlarging lymphadenopathy.
IMPRESSION: Stable solitary benign reactive LEFT axillary lymph node with mild
cortical thickening up to 5 mm. No new or enlarging lymphadenopathy.

RECOMMENDATION:
Annual BILATERAL screening mammography which is due in [REDACTED] or
July 2020.

I have discussed the findings and recommendations with the patient.
If applicable, a reminder letter will be sent to the patient
regarding the next appointment.

BI-RADS CATEGORY  2: Benign.

## 2021-09-16 ENCOUNTER — Ambulatory Visit: Payer: BC Managed Care – PPO | Admitting: Family Medicine

## 2021-10-28 ENCOUNTER — Encounter: Payer: BC Managed Care – PPO | Admitting: Obstetrics & Gynecology

## 2022-03-03 ENCOUNTER — Encounter: Payer: Self-pay | Admitting: Family Medicine

## 2022-03-03 ENCOUNTER — Ambulatory Visit: Payer: BC Managed Care – PPO | Admitting: Family Medicine

## 2022-03-03 VITALS — BP 124/88 | HR 77 | Temp 98.2°F | Ht 63.0 in | Wt 272.5 lb

## 2022-03-03 DIAGNOSIS — Z114 Encounter for screening for human immunodeficiency virus [HIV]: Secondary | ICD-10-CM

## 2022-03-03 DIAGNOSIS — Z23 Encounter for immunization: Secondary | ICD-10-CM

## 2022-03-03 DIAGNOSIS — Z6841 Body Mass Index (BMI) 40.0 and over, adult: Secondary | ICD-10-CM

## 2022-03-03 DIAGNOSIS — E559 Vitamin D deficiency, unspecified: Secondary | ICD-10-CM

## 2022-03-03 DIAGNOSIS — Z1159 Encounter for screening for other viral diseases: Secondary | ICD-10-CM

## 2022-03-03 DIAGNOSIS — Z1211 Encounter for screening for malignant neoplasm of colon: Secondary | ICD-10-CM

## 2022-03-03 DIAGNOSIS — R7303 Prediabetes: Secondary | ICD-10-CM

## 2022-03-03 DIAGNOSIS — M170 Bilateral primary osteoarthritis of knee: Secondary | ICD-10-CM

## 2022-03-03 LAB — LIPID PANEL
Cholesterol: 175 mg/dL (ref 0–200)
HDL: 56.5 mg/dL (ref >39.00)
LDL Cholesterol: 99 mg/dL (ref 0–99)
NonHDL: 118.38
Total CHOL/HDL Ratio: 3
Triglycerides: 99 mg/dL (ref 0.0–149.0)
VLDL: 19.8 mg/dL (ref 0.0–40.0)

## 2022-03-03 LAB — CBC WITH DIFFERENTIAL/PLATELET
Basophils Absolute: 0 10*3/uL (ref 0.0–0.1)
Basophils Relative: 0.7 % (ref 0.0–3.0)
Eosinophils Absolute: 0.1 10*3/uL (ref 0.0–0.7)
Eosinophils Relative: 1.9 % (ref 0.0–5.0)
HCT: 40.5 % (ref 36.0–46.0)
Hemoglobin: 13.4 g/dL (ref 12.0–15.0)
Lymphocytes Relative: 25.1 % (ref 12.0–46.0)
Lymphs Abs: 1.4 10*3/uL (ref 0.7–4.0)
MCHC: 32.9 g/dL (ref 30.0–36.0)
MCV: 82.1 fl (ref 78.0–100.0)
Monocytes Absolute: 0.4 10*3/uL (ref 0.1–1.0)
Monocytes Relative: 6.5 % (ref 3.0–12.0)
Neutro Abs: 3.6 10*3/uL (ref 1.4–7.7)
Neutrophils Relative %: 65.8 % (ref 43.0–77.0)
Platelets: 258 10*3/uL (ref 150.0–400.0)
RBC: 4.94 Mil/uL (ref 3.87–5.11)
RDW: 15.2 % (ref 11.5–15.5)
WBC: 5.5 10*3/uL (ref 4.0–10.5)

## 2022-03-03 LAB — COMPREHENSIVE METABOLIC PANEL
ALT: 21 U/L (ref 0–35)
AST: 25 U/L (ref 0–37)
Albumin: 3.9 g/dL (ref 3.5–5.2)
Alkaline Phosphatase: 102 U/L (ref 39–117)
BUN: 18 mg/dL (ref 6–23)
CO2: 33 mEq/L — ABNORMAL HIGH (ref 19–32)
Calcium: 10.1 mg/dL (ref 8.4–10.5)
Chloride: 101 mEq/L (ref 96–112)
Creatinine, Ser: 0.67 mg/dL (ref 0.40–1.20)
GFR: 94.83 mL/min (ref >60.00)
Glucose, Bld: 99 mg/dL (ref 70–99)
Potassium: 5.4 mEq/L — ABNORMAL HIGH (ref 3.5–5.1)
Sodium: 142 mEq/L (ref 135–145)
Total Bilirubin: 0.6 mg/dL (ref 0.2–1.2)
Total Protein: 6.7 g/dL (ref 6.0–8.3)

## 2022-03-03 LAB — HEMOGLOBIN A1C: Hgb A1c MFr Bld: 6.2 % (ref 4.6–6.5)

## 2022-03-03 LAB — TSH: TSH: 1.25 u[IU]/mL (ref 0.35–5.50)

## 2022-03-03 LAB — VITAMIN D 25 HYDROXY (VIT D DEFICIENCY, FRACTURES): VITD: 28.96 ng/mL — ABNORMAL LOW (ref 30.00–100.00)

## 2022-03-03 NOTE — Progress Notes (Unsigned)
New Patient Office Visit  Subjective:  Patient ID: Ashley Bishop, female    DOB: August 11, 1960  Age: 62 y.o. MRN: [id number]  CC:  Chief Complaint  Patient presents with   Establish Care    Need new pcp Fasting Weight concerns    HPI Ashley Bishop presents for new pt.  Weight Gyn-not for 3 yrs.    PreDM from labs in 2019-pt unaware Injured knee R 6 yrs ago and not able to exercise.  Now L knee a problem.  Saw ortho(M-W).  Gaining wt since.  Not trying hard on diet or alt exercise.  Hard to lose. No specific diet plan.  Will see Dr. Harrell Gave later this month. Can't walk for exercise. Just got exercise bike.  Would like wegovy.no h/o meds.       Past Medical History:  Diagnosis Date   Asthma    baby    Past Surgical History:  Procedure Laterality Date   WISDOM TOOTH EXTRACTION      Family History  Problem Relation Age of Onset   Arthritis Mother    Heart defect Mother 5       pda   Alcohol abuse Father    Transient ischemic attack Father    Breast cancer Sister 39   Diabetes Maternal Grandmother     Social History   Socioeconomic History   Marital status: Married    Spouse name: Not on file   Number of children: 2   Years of education: Not on file   Highest education level: Not on file  Occupational History   Occupation: [employer]  Tobacco Use   Smoking status: Never   Smokeless tobacco: Never  Vaping Use   Vaping Use: Never used  Substance and Sexual Activity   Alcohol use: Yes    Comment: occasional   Drug use: Never   Sexual activity: Yes    Partners: Male    Birth control/protection: Post-menopausal    Comment: intercourse age 75, less than 11 sexual partners, des neg  Other Topics Concern   Not on file  Social History Narrative   Not on file   Social Determinants of Health   Financial Resource Strain: Not on file  Food Insecurity: Not on file  Transportation Needs: Not on file  Physical Activity: Not on file  Stress: Not on file   Social Connections: Not on file  Intimate Partner Violence: Not on file    ROS  ROS: Gen: no fever, chills  Skin: no rash, itching ENT: no ear pain, ear drainage, nasal congestion, rhinorrhea, sinus pressure, sore throat Eyes: no blurry vision, double vision Resp: no cough, wheeze,SOB CV: no CP, palpitations, LE edema,  GI: no heartburn, n/v/d/c, abd pain GU: no dysuria, urgency, frequency, hematuria.  Menopause 3 yrs ago.  MWU:XLKGM. Neuro: no dizziness, headache, weakness, vertigo Psych: no depression, anxiety, insomnia, SI   Objective:   Today's Vitals: BP 124/88   Pulse 77   Temp 98.2 F (36.8 C) (Temporal)   Ht 5\' 3"  (1.6 m)   Wt 272 lb 8 oz (123.6 kg)   LMP 07/19/2017   SpO2 99%   BMI 48.27 kg/m   Physical Exam  Gen: WDWN NAD. MOWF HEENT: NCAT, conjunctiva not injected, sclera nonicteric NECK:  supple, no thyromegaly, no nodes, no carotid bruits CARDIAC: RRR, S1S2+, no murmur. DP 2+B LUNGS: CTAB. No wheezes ABDOMEN:  BS+, soft, NTND, No HSM, no masses EXT:  no edema MSK: no gross abnormalities.  NEURO: A&O x3.  CN II-XII intact.  PSYCH: normal mood. Good eye contact   Reviewed labs-D was 12 in 2019 and A1C 5.9  Assessment & Plan:   Problem List Items Addressed This Visit       Musculoskeletal and Integument   Primary osteoarthritis of both knees     Other   Prediabetes - Primary   Relevant Orders   Comprehensive metabolic panel (Completed)   Hemoglobin A1c (Completed)   Lipid panel (Completed)   TSH (Completed)   CBC with Differential/Platelet (Completed)   Vitamin D deficiency   Relevant Orders   VITAMIN D 25 Hydroxy (Vit-D Deficiency, Fractures) (Completed)   Other Visit Diagnoses     Need for Tdap vaccination       Relevant Orders   Tdap vaccine greater than or equal to 7yo IM (Completed)   Need for shingles vaccine       Relevant Orders   Zoster Recombinant (Shingrix ) (Completed)   Morbid obesity with BMI of 45.0-49.9, adult ([place])        Encounter for hepatitis C screening test for low risk patient       Screening for HIV without presence of risk factors       Screen for colon cancer       Relevant Orders   Cologuard     1.  Prediabetes-patient was totally unaware of this diagnosis.  Discussed risk of diabetes.  Discussed need to work on diet/exercise.  She is limited on exercise due to knees, but just got an exercise bike.  She will start doing that.  We discussed medications, she would like to hold off.  Will check CMP, A1c, lipids, TSH, CBC.  Follow-up 3 months 2.  Vitamin D deficiency-patient was unaware.  Will check vitamin D levels and make determination on replacement. 3.  Bilateral osteoarthritis knees-chronic.  Patient is seeing orthopedics.  She is aware that she needs to lose weight to qualify for knee replacements.  Just bought an exercise bike.  Will start using that.  Discussed diet as well.  Follow-up in 3 months 4.  Morbid obesity-discussed health risks.  Discussed medication use.  She would like to work on diet and exercise herself but considering GLP 1.  Follow-up in 3 months  Outpatient Encounter Medications as of 03/03/2022  Medication Sig   Multiple Vitamins-Minerals (ONE A DAY WOMEN 50 PLUS PO) Take 1 tablet by mouth daily.   [DISCONTINUED] medroxyPROGESTERone (PROVERA) 5 MG tablet Take one tablet by mouth daily for 10 days every 3 months.   No facility-administered encounter medications on file as of 03/03/2022.    Follow-up: Return in about 3 months (around 06/02/2022) for wt.   Wellington Hampshire, MD

## 2022-03-03 NOTE — Patient Instructions (Signed)
Welcome to [facility] at [facility]! It was a pleasure meeting you today.  As discussed, Please schedule a 3 month follow up visit today.  PLEASE NOTE:  If you had any LAB tests please let us know if you have not heard back within a few days. You may see your results on MyChart before we have a chance to review them but we will give you a call once they are reviewed by us. If we ordered any REFERRALS today, please let us know if you have not heard from their office within the next week.  Let us know through MyChart if you are needing REFILLS, or have your pharmacy send us the request. You can also use MyChart to communicate with me or any office staff.  Please try these tips to maintain a healthy lifestyle:  Eat most of your calories during the day when you are active. Eliminate processed foods including packaged sweets (pies, cakes, cookies), reduce intake of potatoes, white bread, white pasta, and white rice. Look for whole grain options, oat flour or almond flour.  Each meal should contain half fruits/vegetables, one quarter protein, and one quarter carbs (no bigger than a computer mouse).  Cut down on sweet beverages. This includes juice, soda, and sweet tea. Also watch fruit intake, though this is a healthier sweet option, it still contains natural sugar! Limit to 3 servings daily.  Drink at least 1 glass of water with each meal and aim for at least 8 glasses per day  Exercise at least 150 minutes every week.   

## 2022-03-04 DIAGNOSIS — E559 Vitamin D deficiency, unspecified: Secondary | ICD-10-CM | POA: Insufficient documentation

## 2022-03-04 DIAGNOSIS — R7303 Prediabetes: Secondary | ICD-10-CM | POA: Insufficient documentation

## 2022-03-04 DIAGNOSIS — M170 Bilateral primary osteoarthritis of knee: Secondary | ICD-10-CM | POA: Insufficient documentation

## 2022-03-05 NOTE — Progress Notes (Signed)
Labs okay except: 1.  Vitamin D is slightly low-take 1000 IUs/day over-the-counter 2.  Suspect the slightly elevated potassium is a problem we have been having with our lab, so I am not concerned 3.A1C(3 month average of sugars) is elevated.  This is considered PreDiabetes.  Work on diet-decrease sugars and starches and aim for 30 minutes of exercise 5 days/week to prevent progression to diabetes

## 2022-05-19 DIAGNOSIS — M1711 Unilateral primary osteoarthritis, right knee: Secondary | ICD-10-CM | POA: Diagnosis not present

## 2022-06-02 ENCOUNTER — Ambulatory Visit: Payer: BC Managed Care – PPO | Admitting: Family Medicine

## 2022-06-05 ENCOUNTER — Encounter (HOSPITAL_BASED_OUTPATIENT_CLINIC_OR_DEPARTMENT_OTHER): Payer: Self-pay | Admitting: Cardiology

## 2022-06-05 ENCOUNTER — Ambulatory Visit (HOSPITAL_BASED_OUTPATIENT_CLINIC_OR_DEPARTMENT_OTHER): Payer: BC Managed Care – PPO | Admitting: Cardiology

## 2022-06-05 VITALS — BP 126/88 | HR 77 | Ht 63.0 in | Wt 273.0 lb

## 2022-06-05 DIAGNOSIS — Z7189 Other specified counseling: Secondary | ICD-10-CM | POA: Diagnosis not present

## 2022-06-05 DIAGNOSIS — Z7182 Exercise counseling: Secondary | ICD-10-CM | POA: Diagnosis not present

## 2022-06-05 DIAGNOSIS — Z8249 Family history of ischemic heart disease and other diseases of the circulatory system: Secondary | ICD-10-CM

## 2022-06-05 DIAGNOSIS — Z6841 Body Mass Index (BMI) 40.0 and over, adult: Secondary | ICD-10-CM

## 2022-06-05 DIAGNOSIS — Z713 Dietary counseling and surveillance: Secondary | ICD-10-CM

## 2022-06-05 NOTE — Patient Instructions (Signed)
Medication Instructions:  Your physician recommends that you continue on your current medications as directed. Please refer to the Current Medication list given to you today.  *If you need a refill on your cardiac medications before your next appointment, please call your pharmacy*  Lab Work: NONE  Testing/Procedures: NONE  Follow-Up: At [facility], you and your health needs are our priority.  As part of our continuing mission to provide you with exceptional heart care, we have created designated Provider Care Teams.  These Care Teams include your primary Cardiologist (physician) and Advanced Practice Providers (APPs -  Physician Assistants and Nurse Practitioners) who all work together to provide you with the care you need, when you need it.  We recommend signing up for the patient portal called "MyChart".  Sign up information is provided on this After Visit Summary.  MyChart is used to connect with patients for Virtual Visits (Telemedicine).  Patients are able to view lab/test results, encounter notes, upcoming appointments, etc.  Non-urgent messages can be sent to your provider as well.   To learn more about what you can do with MyChart, go to [email or web address].    Your next appointment:   12 month(s)  The format for your next appointment:   In Person  Provider:   Bridgette Christopher, MD     

## 2022-06-05 NOTE — Progress Notes (Signed)
Cardiology Office Note:    Date:  06/05/2022   ID:  Ashley Bishop, DOB 11/25/1960, MRN [id number]  PCP:  Ashley Bishop, Ashley Marie, Ashley Bishop  Cardiologist:  Ashley RedBridgette Sharmin Foulk, Ashley Bishop  Referring Ashley Bishop: Ashley Bishop, Natarsha Marie, Ashley Bishop   CC:  New patient evaluation for cardiovascular risk stratification   History of Present Illness:    Ashley Roysnn Gunn [address]cap is a 62 y.o. female with a hx of prediabetes, morbid obesity, asthma, osteoarthritis, vitamin D deficiency, who is seen as a new consult at the request of Ashley Bishop, Xochitl Marie, Ashley Bishop for evaluation and management of cardiovascular risk stratification.  She was seen by Dr. Ruthine DoseKulik 03/03/2022. It was noted that she had been unaware of her prediabetes diagnosis from labs in 2019. Labs 03/03/2022 showed A1c 6.2 and slightly low vitamin D. Her exercise is limited by osteoarthritis of her bilateral knees. She needs to lose weight to qualify for knee replacements. She deferred starting any new medications; she wanted to first work on diet and exercise herself. However, she was considering [employer].   Cardiovascular risk factors: Prior clinical ASCVD: None. Comorbid conditions:  Prediabetes - A1c of 6.2 in 02/2022.  Metabolic syndrome/Obesity: Currently 273 lbs in clinic. Highest adult weight is her current weight. She injured her right knee and gained 60 lbs since then due to inactivity. Chronic inflammatory conditions:  None. Tobacco use history:  Briefly in college. None since. Family history:  Her grandmother had diabetes. Her mother is 61 yo and in good health. Her father died at 760 yo with cardiac ischemia; noted agent orange exposure. Her father and brother also have sleep disorders. Prior cardiac testing and/or incidental findings on other testing (ie coronary calcium):  Never had a prior checkup for her heart. Exercise level:  Cortisone seems to be helping her knee pain, but her activity is significantly limited. She has an exercise bike at home, which she plans to start soon. Current  diet:  She describes having a cycle of losing some weight, but then feels she is starving. Taking a vitamin D supplement.  For years she has struggled with insomnia. She states that she doesn't sleep well, no more than 2 hours at a time on average. She notes that she is able to fall asleep initially, but has difficulty with staying asleep. Her husband has not noticed that she snores or stops breathing. In the mornings she does not feel well rested. No improvement with trying melatonin in the past.  She denies any palpitations, chest pain, shortness of breath, or peripheral edema. No lightheadedness, headaches, syncope, orthopnea, or PND.  Past Medical History:  Diagnosis Date   Asthma    baby    Past Surgical History:  Procedure Laterality Date   WISDOM TOOTH EXTRACTION      Current Medications: Current Outpatient Medications on File Prior to Visit  Medication Sig   cholecalciferol (VITAMIN D3) 25 MCG (1000 UNIT) tablet Take 1,000 Units by mouth daily.   Multiple Vitamins-Minerals (ONE A DAY WOMEN 50 PLUS PO) Take 1 tablet by mouth daily.   No current facility-administered medications on file prior to visit.     Allergies:   Erythromycin   Social History   Tobacco Use   Smoking status: Never   Smokeless tobacco: Never  Vaping Use   Vaping Use: Never used  Substance Use Topics   Alcohol use: Yes    Comment: occasional   Drug use: Never    Family History: family history includes Alcohol abuse in her father; Arthritis  in her mother; Breast cancer (age of onset: 30) in her sister; Diabetes in her maternal grandmother; Heart defect (age of onset: 62) in her mother; Transient ischemic attack in her father.  ROS:   Please see the history of present illness.  Additional pertinent ROS: Constitutional: Negative for chills, fever, night sweats, unintentional weight loss  HENT: Negative for ear pain and hearing loss.   Eyes: Negative for loss of vision and eye pain.  Respiratory:  Negative for cough, sputum, wheezing.   Cardiovascular: See HPI. Gastrointestinal: Negative for abdominal pain, melena, and hematochezia.  Genitourinary: Negative for dysuria and hematuria.  Musculoskeletal: Negative for falls and myalgias. Positive for bilateral knee pain. Skin: Negative for itching and rash.  Neurological: Negative for focal weakness, focal sensory changes and loss of consciousness. Positive for insomnia.  Endo/Heme/Allergies: Does not bruise/bleed easily.     EKGs/Labs/Other Studies Reviewed:    The following studies were reviewed today:  No prior cardiovascular studies available.   EKG:  EKG is personally reviewed.   06/05/2022:  NSR at 77 bpm  Recent Labs: 03/03/2022: ALT 21; BUN 18; Creatinine, Ser 0.67; Hemoglobin 13.4; Platelets 258.0; Potassium 5.4 No hemolysis seen; Sodium 142; TSH 1.25   Recent Lipid Panel    Component Value Date/Time   CHOL 175 03/03/2022 1056   TRIG 99.0 03/03/2022 1056   HDL 56.50 03/03/2022 1056   CHOLHDL 3 03/03/2022 1056   VLDL 19.8 03/03/2022 1056   LDLCALC 99 03/03/2022 1056   LDLCALC 107 (H) 08/31/2017 1116    Physical Exam:    VS:  BP 126/88 (BP Location: Right Arm, Patient Position: Sitting, Cuff Size: Large)   Pulse 77   Ht 5\' 3"  (1.6 m)   Wt 273 lb (123.8 kg)   LMP 07/19/2017   BMI 48.36 kg/m     Wt Readings from Last 3 Encounters:  06/05/22 273 lb (123.8 kg)  03/03/22 272 lb 8 oz (123.6 kg)  10/01/20 275 lb (124.7 kg)    GEN: Well nourished, well developed in no acute distress HEENT: Normal, moist mucous membranes NECK: No JVD CARDIAC: regular rhythm, normal S1 and S2, no rubs or gallops. No murmur. VASCULAR: Radial and DP pulses 2+ bilaterally. No carotid bruits RESPIRATORY:  Clear to auscultation without rales, wheezing or rhonchi  ABDOMEN: Soft, non-tender, non-distended MUSCULOSKELETAL:  Ambulates independently SKIN: Warm and dry, no edema NEUROLOGIC:  Alert and oriented x 3. No focal neuro deficits  noted. PSYCHIATRIC:  Normal affect    ASSESSMENT:    1. Class 3 severe obesity due to excess calories without serious comorbidity with body mass index (BMI) of 45.0 to 49.9 in adult   2. Cardiac risk counseling   3. Counseling on health promotion and disease prevention   4. Family history of heart disease   5. Exercise counseling   6. Nutritional counseling    PLAN:    Obesity, BMI 48 Family history of heart disease -we discussed CV risk, options for assessment, and options for treatment today, including GLP1RA. She has been told that [place] are not on her formulary -we discussed diet and exercise recommendations at length -discussed risk of sleep apnea, stopbang=4 today -she will continue to work on lifestyle, and she is very interested in GLP1RA if this becomes available to her   Cardiac risk counseling and prevention recommendations: -recommend heart healthy/Mediterranean diet, with whole grains, fruits, vegetable, fish, lean meats, nuts, and olive oil. Limit salt. -recommend moderate walking, 3-5 times/week for 30-50 minutes  each session. Aim for at least 150 minutes.week. Goal should be pace of 3 miles/hours, or walking 1.5 miles in 30 minutes -recommend avoidance of tobacco products. Avoid excess alcohol. -ASCVD risk score: The 10-year ASCVD risk score (Arnett DK, et al., 2019) is: 2.8%   Values used to calculate the score:     Age: [age] years     Sex: Female     Is Non-Hispanic African American: No     Diabetic: No     Tobacco smoker: No     Systolic Blood Pressure: 126 mmHg     Is BP treated: No     HDL Cholesterol: 56.5 mg/dL     Total Cholesterol: 175 mg/dL    Plan for follow up: 1 year or sooner as needed.  Ashley RedBridgette Meiling Hendriks, MD, PhD, [facility] [place]  [facility]    Medication Adjustments/Labs and Tests Ordered: Current medicines are reviewed at length with the patient today.  Concerns regarding medicines are outlined above.   Orders Placed  This Encounter  Procedures   EKG 12-Lead   No orders of the defined types were placed in this encounter.  Patient Instructions  Medication Instructions:  Your physician recommends that you continue on your current medications as directed. Please refer to the Current Medication list given to you today.  *If you need a refill on your cardiac medications before your next appointment, please call your pharmacy*  Lab Work: NONE  Testing/Procedures: NONE  Follow-Up: At [facility], you and your health needs are our priority.  As part of our continuing mission to provide you with exceptional heart care, we have created designated Provider Care Teams.  These Care Teams include your primary Cardiologist (physician) and Advanced Practice Providers (APPs -  Physician Assistants and Nurse Practitioners) who all work together to provide you with the care you need, when you need it.  We recommend signing up for the patient portal called "MyChart".  Sign up information is provided on this After Visit Summary.  MyChart is used to connect with patients for Virtual Visits (Telemedicine).  Patients are able to view lab/test results, encounter notes, upcoming appointments, etc.  Non-urgent messages can be sent to your provider as well.   To learn more about what you can do with MyChart, go to [email or web address].    Your next appointment:   12 month(s)  The format for your next appointment:   In Person  Provider:   Jodelle RedBridgette Arleny Kruger, Ashley Bishop       [facility],Mathew Stumpf,acting as a scribe for Ashley RedBridgette Helga Asbury, Ashley Bishop.,have documented all relevant documentation on the behalf of Ashley RedBridgette Cherryl Babin, Ashley Bishop,as directed by  Ashley RedBridgette Airam Heidecker, Ashley Bishop while in the presence of Ashley RedBridgette Sheron Robin, Ashley Bishop.  I, Ashley RedBridgette Demiah Gullickson, Ashley Bishop, have reviewed all documentation for this visit. The documentation on 06/05/22 for the exam, diagnosis, procedures, and orders are all accurate and  complete.  Signed, Ashley RedBridgette Reuben Knoblock, MD PhD 06/05/2022    [facility]

## 2023-02-19 ENCOUNTER — Ambulatory Visit
Admission: EM | Admit: 2023-02-19 | Discharge: 2023-02-19 | Disposition: A | Discharge status: Home / Self Care | Payer: BC Managed Care – PPO | Attending: Family Medicine | Admitting: Family Medicine

## 2023-02-19 DIAGNOSIS — T148XXA Other injury of unspecified body region, initial encounter: Secondary | ICD-10-CM

## 2023-02-19 DIAGNOSIS — Z23 Encounter for immunization: Secondary | ICD-10-CM

## 2023-02-19 DIAGNOSIS — W540XXA Bitten by dog, initial encounter: Secondary | ICD-10-CM | POA: Diagnosis not present

## 2023-02-19 MED ORDER — AMOXICILLIN-POT CLAVULANATE 875-125 MG PO TABS: 1.0000 | ORAL_TABLET | Freq: Two times a day (BID) | ORAL | 0 refills | Status: AC

## 2023-02-19 MED ORDER — TETANUS-DIPHTH-ACELL PERTUSSIS 5-2.5-18.5 LF-MCG/0.5 IM SUSY
0.5000 mL | PREFILLED_SYRINGE | Freq: Once | INTRAMUSCULAR | Status: AC
  Administered 2023-02-19: 0.5 mL via INTRAMUSCULAR

## 2023-02-19 NOTE — Discharge Instructions (Signed)
We have updated your tetanus shot today and started you on a course of antibiotics.  Continue cleaning the area daily and applying Neosporin and bandaging.  Follow-up for worsening symptoms.

## 2023-02-19 NOTE — ED Provider Notes (Signed)
[facility]    CSN: [id number] Arrival date & time: 02/19/23  1412      History   Chief Complaint Chief Complaint  Patient presents with   Animal Bite    HPI Ashley Bishop is a 62 y.o. female.   Patient presenting today with a dog bite to the left buttocks that occurred 3 days ago.  States the area has been bruised, swollen and tender but seems to be healing well without any drainage, bleeding.  Has been keeping it clean with soap and water and apply Neosporin and bandages.  She states the dog is up-to-date on rabies and was her friend's dog.  She is requesting a tetanus shot as her last one was more than 10 years ago.    Past Medical History:  Diagnosis Date   Asthma    baby    Patient Active Problem List   Diagnosis Date Noted   Primary osteoarthritis of both knees 03/04/2022   Prediabetes 03/04/2022   Vitamin D deficiency 03/04/2022    Past Surgical History:  Procedure Laterality Date   WISDOM TOOTH EXTRACTION      OB History     Gravida  2   Para      Term      Preterm      AB  0   Living  2      SAB      IAB      Ectopic  0   Multiple      Live Births               Home Medications    Prior to Admission medications   Medication Sig Start Date End Date Taking? Authorizing Provider  amoxicillin-clavulanate (AUGMENTIN) 875-125 MG tablet Take 1 tablet by mouth every 12 (twelve) hours. 02/19/23  Yes Particia Nearing, PA-C  cholecalciferol (VITAMIN D3) 25 MCG (1000 UNIT) tablet Take 1,000 Units by mouth daily.    [provider]  Multiple Vitamins-Minerals (ONE A DAY WOMEN 50 PLUS PO) Take 1 tablet by mouth daily.    [provider]    Family History Family History  Problem Relation Age of Onset   Arthritis Mother    Heart defect Mother 1       pda   Alcohol abuse Father    Transient ischemic attack Father    Breast cancer Sister 76   Diabetes Maternal Grandmother     Social  History Social History   Tobacco Use   Smoking status: Never   Smokeless tobacco: Never  Vaping Use   Vaping status: Never Used  Substance Use Topics   Alcohol use: Yes    Comment: occasional   Drug use: Never     Allergies   Erythromycin   Review of Systems Review of Systems Per HPI  Physical Exam Triage Vital Signs ED Triage Vitals  Encounter Vitals Group     BP 02/19/23 1505 136/84     Systolic BP Percentile --      Diastolic BP Percentile --      Pulse Rate 02/19/23 1450 96     Resp 02/19/23 1450 16     Temp 02/19/23 1450 98.3 F (36.8 C)     Temp Source 02/19/23 1450 Oral     SpO2 02/19/23 1450 98 %     Weight --      Height --      Head Circumference --      Peak Flow --  Pain Score --      Pain Loc --      Pain Education --      Exclude from Growth Chart --    No data found.  Updated Vital Signs BP 136/84   Pulse 96   Temp 98.3 F (36.8 C) (Oral)   Resp 16   LMP 07/19/2017   SpO2 98%   Visual Acuity Right Eye Distance:   Left Eye Distance:   Bilateral Distance:    Right Eye Near:   Left Eye Near:    Bilateral Near:     Physical Exam Vitals and nursing note reviewed.  Constitutional:      Appearance: Normal appearance. She is not ill-appearing.  HENT:     Head: Atraumatic.  Eyes:     Extraocular Movements: Extraocular movements intact.     Conjunctiva/sclera: Conjunctivae normal.  Cardiovascular:     Rate and Rhythm: Normal rate and regular rhythm.     Heart sounds: Normal heart sounds.  Pulmonary:     Effort: Pulmonary effort is normal.     Breath sounds: Normal breath sounds.  Musculoskeletal:        General: Normal range of motion.     Cervical back: Normal range of motion and neck supple.  Skin:    General: Skin is warm.     Comments: 2 areas of superficial punctures to the left buttocks with surrounding hematomas.  No bleeding, drainage, induration, fluctuance  Neurological:     Mental Status: She is alert and  oriented to person, place, and time.  Psychiatric:        Mood and Affect: Mood normal.        Thought Content: Thought content normal.        Judgment: Judgment normal.      UC Treatments / Results  Labs (all labs ordered are listed, but only abnormal results are displayed) Labs Reviewed - No data to display  EKG   Radiology No results found.  Procedures Procedures (including critical care time)  Medications Ordered in UC Medications  Tdap (BOOSTRIX) injection 0.5 mL (0.5 mLs Intramuscular Given 02/19/23 1522)    Initial Impression / Assessment and Plan / UC Course  I have reviewed the triage vital signs and the nursing notes.  Pertinent labs & imaging results that were available during my care of the patient were reviewed by me and considered in my medical decision making (see chart for details).     Will treat with Augmentin, good home wound care and update tetanus shot today.  Supportive home care and return precautions reviewed.  Final Clinical Impressions(s) / UC Diagnoses   Final diagnoses:  Skin abrasion  Dog bite, initial encounter     Discharge Instructions      We have updated your tetanus shot today and started you on a course of antibiotics.  Continue cleaning the area daily and applying Neosporin and bandaging.  Follow-up for worsening symptoms.    ED Prescriptions     Medication Sig Dispense Auth. Provider   amoxicillin-clavulanate (AUGMENTIN) 875-125 MG tablet Take 1 tablet by mouth every 12 (twelve) hours. 14 tablet Particia Nearing, [place]      PDMP not reviewed this encounter.   Particia Nearing, [place] 02/19/23 1531

## 2023-02-19 NOTE — ED Triage Notes (Addendum)
Pt states she was bit on her left buttocks by her friends dog Friday. Pt states the dogs have rabies shot up to date.    Pt states its been more than 10 years since her last tetanus shot.
